# Patient Record
Sex: Male | Born: 2000 | Hispanic: Yes | Marital: Single | Smoking: Current every day smoker
Health system: Southern US, Community
[De-identification: ages and names within clinical notes are randomized; demographics above are authoritative.]

---

## 2020-02-07 ENCOUNTER — Emergency Department (HOSPITAL_COMMUNITY)
Admission: EM | Admit: 2020-02-07 | Discharge: 2020-02-08 | Disposition: A | Discharge status: Home / Self Care | Payer: Self-pay | Attending: Emergency Medicine | Admitting: Emergency Medicine

## 2020-02-07 ENCOUNTER — Other Ambulatory Visit: Payer: Self-pay

## 2020-02-07 ENCOUNTER — Encounter (HOSPITAL_COMMUNITY): Payer: Self-pay | Admitting: Emergency Medicine

## 2020-02-07 DIAGNOSIS — Z20822 Contact with and (suspected) exposure to covid-19: Secondary | ICD-10-CM | POA: Insufficient documentation

## 2020-02-07 DIAGNOSIS — G252 Other specified forms of tremor: Secondary | ICD-10-CM | POA: Insufficient documentation

## 2020-02-07 DIAGNOSIS — F17210 Nicotine dependence, cigarettes, uncomplicated: Secondary | ICD-10-CM | POA: Insufficient documentation

## 2020-02-07 LAB — CBC WITH DIFFERENTIAL/PLATELET
Abs Immature Granulocytes: 0.05 10*3/uL (ref 0.00–0.07)
Basophils Absolute: 0.1 10*3/uL (ref 0.0–0.1)
Basophils Relative: 1 %
Eosinophils Absolute: 0.4 10*3/uL (ref 0.0–0.5)
Eosinophils Relative: 5 %
HCT: 48.3 % (ref 39.0–52.0)
Hemoglobin: 15.9 g/dL (ref 13.0–17.0)
Immature Granulocytes: 1 %
Lymphocytes Relative: 30 %
Lymphs Abs: 2.6 10*3/uL (ref 0.7–4.0)
MCH: 30 pg (ref 26.0–34.0)
MCHC: 32.9 g/dL (ref 30.0–36.0)
MCV: 91.1 fL (ref 80.0–100.0)
Monocytes Absolute: 0.8 10*3/uL (ref 0.1–1.0)
Monocytes Relative: 9 %
Neutro Abs: 5 10*3/uL (ref 1.7–7.7)
Neutrophils Relative %: 54 %
Platelets: 346 10*3/uL (ref 150–400)
RBC: 5.3 MIL/uL (ref 4.22–5.81)
RDW: 13.2 % (ref 11.5–15.5)
WBC: 9 10*3/uL (ref 4.0–10.5)
nRBC: 0 % (ref 0.0–0.2)

## 2020-02-07 LAB — COMPREHENSIVE METABOLIC PANEL
ALT: 33 U/L (ref 0–44)
AST: 26 U/L (ref 15–41)
Albumin: 4.8 g/dL (ref 3.5–5.0)
Alkaline Phosphatase: 75 U/L (ref 38–126)
Anion gap: 8 (ref 5–15)
BUN: 17 mg/dL (ref 6–20)
CO2: 27 mmol/L (ref 22–32)
Calcium: 9.5 mg/dL (ref 8.9–10.3)
Chloride: 102 mmol/L (ref 98–111)
Creatinine, Ser: 0.75 mg/dL (ref 0.61–1.24)
GFR calc Af Amer: >60 mL/min (ref >60)
GFR calc non Af Amer: >60 mL/min (ref >60)
Glucose, Bld: 96 mg/dL (ref 70–99)
Potassium: 3.9 mmol/L (ref 3.5–5.1)
Sodium: 137 mmol/L (ref 135–145)
Total Bilirubin: 0.8 mg/dL (ref 0.3–1.2)
Total Protein: 8.3 g/dL — ABNORMAL HIGH (ref 6.5–8.1)

## 2020-02-07 LAB — ETHANOL: Alcohol, Ethyl (B): <10 mg/dL (ref <10)

## 2020-02-07 LAB — POC SARS CORONAVIRUS 2 AG -  ED: SARS Coronavirus 2 Ag: NEGATIVE

## 2020-02-07 LAB — CBG MONITORING, ED: Glucose-Capillary: 95 mg/dL (ref 70–99)

## 2020-02-07 MED ORDER — LORAZEPAM 1 MG PO TABS
1.0000 mg | ORAL_TABLET | Freq: Once | ORAL | Status: AC
  Administered 2020-02-07: 1 mg via ORAL
  Filled 2020-02-07: qty 1

## 2020-02-07 NOTE — ED Provider Notes (Signed)
[facility] EMERGENCY DEPARTMENT Provider Note   CSN: [id number]  Arrival date & time: 02/07/20  1906     History Chief Complaint  Patient presents with  . Tremors    Lee Dennis is a 19 y.o. male presenting to the ED with neck pain and tremors.  Patient reports he woke up this morning feeling painful sensation in the back of his neck.  He said his felt very tremulous in his arms and hands.  He said he went to work where he works at [employer], reports that he was increasingly tremulous and began to feel lightheaded around noon.  He was very emotional breakdown was crying.  He said this is never happened him before.  He continues to feel very shaky.  He denies any headache.  He denies any abdominal pain.  He denies nausea vomiting or diarrhea.  He denies any chest pain or shortness of breath.  Says he wears his Pap and protective care at work  HPI     History reviewed. No pertinent past medical history.  There are no problems to display for this patient.   History reviewed. No pertinent surgical history.     No family history on file.  Social History   Tobacco Use  . Smoking status: Current Every Day Smoker    Packs/day: 0.50    Types: Cigarettes  . Smokeless tobacco: Never Used  Substance Use Topics  . Alcohol use: Yes    Alcohol/week: 6.0 standard drinks    Types: 6 Cans of beer per week    Comment: occ  . Drug use: Not on file    Home Medications Prior to Admission medications   Not on File    Allergies    Patient has no allergy information on record.  Review of Systems   Review of Systems  Constitutional: Negative for chills and fever.  Eyes: Negative for pain and visual disturbance.  Respiratory: Negative for cough and shortness of breath.   Cardiovascular: Negative for chest pain and palpitations.  Gastrointestinal: Negative for abdominal pain and vomiting.  Skin: Negative for color change and rash.  Neurological: Positive for  tremors and light-headedness. Negative for syncope, facial asymmetry, speech difficulty and headaches.  All other systems reviewed and are negative.   Physical Exam Updated Vital Signs BP 135/82   Pulse 80   Temp 98.2 F (36.8 C) (Oral)   Resp 20   Ht 5\' 8"  (1.727 m)   Wt 111.6 kg   SpO2 100%   BMI 37.40 kg/m   Physical Exam Vitals and nursing note reviewed.  Constitutional:      Appearance: He is well-developed.  HENT:     Head: Normocephalic and atraumatic.     Comments: No discoloration of the gum lines Eyes:     Conjunctiva/sclera: Conjunctivae normal.  Cardiovascular:     Rate and Rhythm: Normal rate and regular rhythm.     Pulses: Normal pulses.     Heart sounds: No murmur.  Pulmonary:     Effort: Pulmonary effort is normal. No respiratory distress.     Breath sounds: Normal breath sounds.  Musculoskeletal:     Cervical back: Neck supple. No rigidity.  Skin:    General: Skin is warm and dry.  Neurological:     Mental Status: He is alert.     GCS: GCS eye subscore is 4. GCS verbal subscore is 5. GCS motor subscore is 6.     Cranial Nerves: Cranial nerves  are intact. No dysarthria or facial asymmetry.     Sensory: Sensation is intact.     Motor: Tremor present. No weakness.     Coordination: Coordination is intact.     Gait: Gait is intact.     Comments: Bilateral hand tremors at rest  Psychiatric:     Comments: Anxious     ED Results / Procedures / Treatments   Labs (all labs ordered are listed, but only abnormal results are displayed) Labs Reviewed  COMPREHENSIVE METABOLIC PANEL - Abnormal; Notable for the following components:      Result Value   Total Protein 8.3 (*)    All other components within normal limits  CBC WITH DIFFERENTIAL/PLATELET  ETHANOL  CBG MONITORING, ED  POC SARS CORONAVIRUS 2 AG -  ED    EKG EKG Interpretation  Date/Time:  Thursday February 07 2020 21:42:21 EST Ventricular Rate:  88 PR Interval:    QRS Duration: 84 QT  Interval:  343 QTC Calculation: 415 R Axis:   68 Text Interpretation: Sinus rhythm No STEMI Confirmed by Alvester Chou [phone]) on 02/07/2020 11:30:32 PM   Radiology No results found.  Procedures Procedures (including critical care time)  Medications Ordered in ED Medications  LORazepam (ATIVAN) tablet 1 mg (1 mg Oral Given 02/07/20 2142)    ED Course  I have reviewed the triage vital signs and the nursing notes.  Pertinent labs & imaging results that were available during my care of the patient were reviewed by me and considered in my medical decision making (see chart for details).  19 yo male presenting to ED with bilateral hands tremors, feeling of anxiety, and episode of near-syncope or lightheadedness while at work.    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  He is due for a test tomorrow.  He does not want one here.  His hgb does not show microcytic anemia consistent with lead exposure.  He also does not describe any other symptoms of chronic lead toxicity (eg. Sleep disturbance, GI upset, constipation, tremulousness). I think it's reasonable to let him have this testing done tomorrow.  His bloodwork is otherwise unremarkable.  Electrolytes are wnl.  ECG is benign, no evidence of arrythmia or heart block.  I have a low suspicion for PE or stroke based on this presentation.  He thinks about 1-2 beers per day, but has no hx of etoh withdrawal, and often goes many days without drinking.  This seems less likely a cause.  I gave a dose of ativan here, he feels it significantly helped his tremors.  There may be an anxiety component to this, particularly regarding his near-syncope.  However I stressed the importance of follow up as an outpatient, and reminded him that the ED cannot perform an exhaustive diagnostic workup.  There may still  be an underlying medical cause for his symptoms.  He verbalized understanding.   Final Clinical Impression(s) / ED Diagnoses Final diagnoses:  Coarse tremors    Rx / DC Orders ED Discharge Orders    None       Terald Sleeper, MD 02/08/20 1218

## 2020-02-07 NOTE — Discharge Instructions (Addendum)
Your work-up today in the emergency department did not show any emergent causes for your symptoms.  I did recommend that you follow-up for your lead blood level testing as scheduled with your workplace tomorrow.  You declined to have this test drawn in the ER.  Otherwise, your blood work did not show any evidence of dehydration, electrolyte abnormalities, or infection.  Your EKG did not show any issues with your heart, including arrhythmias or heart attack.  Your Covid test was negative.  Although your work-up here was negative, this does not mean that there is not a medical reason for your symptoms.  We therefore strongly recommend that you follow-up with your primary care doctor or in the wellness clinic if you continue having symptoms.  Keep yourself hydrated and take a day or 2 to rest at home.

## 2020-02-07 NOTE — ED Triage Notes (Signed)
Patient states he woke up with tremors that began in the back of his neck, hands and arms. Tremors are visible in triage. Patient states he works for a [employer] and he may have a lead exposure.

## 2020-02-21 ENCOUNTER — Other Ambulatory Visit: Payer: Self-pay

## 2020-02-21 ENCOUNTER — Emergency Department (HOSPITAL_COMMUNITY)
Admission: EM | Admit: 2020-02-21 | Discharge: 2020-02-22 | Disposition: A | Discharge status: Home / Self Care | Payer: Self-pay | Attending: Emergency Medicine | Admitting: Emergency Medicine

## 2020-02-21 ENCOUNTER — Emergency Department (HOSPITAL_COMMUNITY): Payer: Self-pay

## 2020-02-21 ENCOUNTER — Encounter (HOSPITAL_COMMUNITY): Payer: Self-pay

## 2020-02-21 DIAGNOSIS — Z20822 Contact with and (suspected) exposure to covid-19: Secondary | ICD-10-CM | POA: Insufficient documentation

## 2020-02-21 DIAGNOSIS — F17210 Nicotine dependence, cigarettes, uncomplicated: Secondary | ICD-10-CM | POA: Insufficient documentation

## 2020-02-21 DIAGNOSIS — Z79899 Other long term (current) drug therapy: Secondary | ICD-10-CM | POA: Insufficient documentation

## 2020-02-21 DIAGNOSIS — F419 Anxiety disorder, unspecified: Secondary | ICD-10-CM | POA: Insufficient documentation

## 2020-02-21 DIAGNOSIS — R1013 Epigastric pain: Secondary | ICD-10-CM | POA: Insufficient documentation

## 2020-02-21 LAB — COMPREHENSIVE METABOLIC PANEL
ALT: 37 U/L (ref 0–44)
AST: 24 U/L (ref 15–41)
Albumin: 4.3 g/dL (ref 3.5–5.0)
Alkaline Phosphatase: 70 U/L (ref 38–126)
Anion gap: 9 (ref 5–15)
BUN: 18 mg/dL (ref 6–20)
CO2: 26 mmol/L (ref 22–32)
Calcium: 9.3 mg/dL (ref 8.9–10.3)
Chloride: 104 mmol/L (ref 98–111)
Creatinine, Ser: 0.85 mg/dL (ref 0.61–1.24)
GFR calc Af Amer: >60 mL/min (ref >60)
GFR calc non Af Amer: >60 mL/min (ref >60)
Glucose, Bld: 104 mg/dL — ABNORMAL HIGH (ref 70–99)
Potassium: 3.8 mmol/L (ref 3.5–5.1)
Sodium: 139 mmol/L (ref 135–145)
Total Bilirubin: 0.3 mg/dL (ref 0.3–1.2)
Total Protein: 7.3 g/dL (ref 6.5–8.1)

## 2020-02-21 LAB — CBC WITH DIFFERENTIAL/PLATELET
Abs Immature Granulocytes: 0.05 10*3/uL (ref 0.00–0.07)
Basophils Absolute: 0.1 10*3/uL (ref 0.0–0.1)
Basophils Relative: 1 %
Eosinophils Absolute: 0.7 10*3/uL — ABNORMAL HIGH (ref 0.0–0.5)
Eosinophils Relative: 8 %
HCT: 45 % (ref 39.0–52.0)
Hemoglobin: 14.4 g/dL (ref 13.0–17.0)
Immature Granulocytes: 1 %
Lymphocytes Relative: 26 %
Lymphs Abs: 2.2 10*3/uL (ref 0.7–4.0)
MCH: 29.6 pg (ref 26.0–34.0)
MCHC: 32 g/dL (ref 30.0–36.0)
MCV: 92.6 fL (ref 80.0–100.0)
Monocytes Absolute: 0.8 10*3/uL (ref 0.1–1.0)
Monocytes Relative: 9 %
Neutro Abs: 4.8 10*3/uL (ref 1.7–7.7)
Neutrophils Relative %: 55 %
Platelets: 312 10*3/uL (ref 150–400)
RBC: 4.86 MIL/uL (ref 4.22–5.81)
RDW: 13.2 % (ref 11.5–15.5)
WBC: 8.6 10*3/uL (ref 4.0–10.5)
nRBC: 0 % (ref 0.0–0.2)

## 2020-02-21 LAB — LIPASE, BLOOD: Lipase: 20 U/L (ref 11–51)

## 2020-02-21 MED ORDER — SERTRALINE HCL 50 MG PO TABS: 50.0000 mg | ORAL_TABLET | Freq: Every day | ORAL | 0 refills | Status: AC

## 2020-02-21 NOTE — ED Notes (Signed)
Pt in bed, cardiac monitor in place, pt appears to be in sinus rhythm, pt states that he pain started yesterday when he woke, states that he is better today, states that his pain is worse when he exhales.  resps even and unlabored.  Pt talking in full complete sentences.  Call bell within reach .

## 2020-02-21 NOTE — Discharge Instructions (Addendum)
Your testing was unremarkable - the cause of your pain is not clear but does not appear to be your heart or your lungs or your abdominal organs - start taking pepcid daily for 30 days You may have some anxiety - with the tremor in your arms - please start taking zoloft - I have precribed this medicine for your for one month, you will need to keep taking this (don't stop abruptly), please see the list below for local family doctor follow ups.  ER for worsening symptoms.  [facility] Primary Care Doctor List    Kari Baars MD. Specialty: Pulmonary Disease Contact information: [address]  [address]  [place] [address]  [phone]   Syliva Overman, MD. Specialty: [facility] Medicine Contact information: [address]  [place] [address]  [phone]   Lilyan Punt, MD. Specialty: [facility] Medicine Contact information: [address]  [place] [address]  [phone]   Avon Gully, MD Specialty: Internal Medicine Contact information: [address] [place] [address]  [phone]   Catalina Pizza, MD. Specialty: Internal Medicine Contact information: [address]  [place] [address]  [phone]    [facility] (Dr. Selena Batten) Specialty: Family Medicine Contact information: [address]  [place] [address]  [phone]   John Giovanni, MD. Specialty: [facility] Medicine Contact information: [address]  [address]  [place] [address]  [phone]   Carylon Perches, MD. Specialty: Internal Medicine Contact information: [address]  [address]  [place] [address]  [phone]    [facility]  [address] [place], [place] [address] [phone]  Services [facility] offers a variety of basic health services.  Services include but are not limited to: Blood pressure checks  Heart rate checks  Blood sugar checks  Urine  analysis  Rapid strep tests  Pregnancy tests.  Health education and referrals  People needing more complex services will be directed to a physician online. Using these virtual visits, doctors can evaluate and prescribe medicine and treatments. There will be no medication on-site, though [facility] will help patients fill their prescriptions at little to no cost.   For More information please go to: [email or web address]

## 2020-02-21 NOTE — ED Triage Notes (Signed)
Pt reports pain with deep breathing that started yesterday, pain to left and right side of chest and into his back on both sides.  Pt also reports "shaking" and tremors regularly.  Pt seen a week ago for same.

## 2020-02-21 NOTE — ED Provider Notes (Signed)
[facility] EMERGENCY DEPARTMENT Provider Note   CSN: [id number] Arrival date & time: 02/21/20  2130     History Chief Complaint  Patient presents with  . Chest Pain    Lee Dennis is a 19 y.o. male.  HPI   This patient is a 19 year old male, he has moved to this area recently approximately 3 months ago after losing his job at [employer].  The patient has no known history of abdominal surgery, no history of cardiac disease or lung disease.  He reports that he has recently as of yesterday started to develop some pain in his upper abdomen and lower chest bilaterally which occurs when he breathes out.  He does not have pain with breathing in, he is not coughing or feeling particularly short of breath.  He has no swelling of his legs, he does not smoke cigarettes anymore, he is currently using a vape which he is not very is very much of the last week.  He reports multiple people in the house have had a recent respiratory illness including his wife who has been coughing and a 50-year-old child who is also had a recent cough and a runny nose.  He denies any diarrhea, bleeding, rashes, though he does state that he has been nauseated and when he eats it makes the pain worse.  He does drink alcohol, states it takes him about 1 month ago through a case of beer.  The patient is actively tremulous and states that he does not have a history of mental health disorders anxiety or hallucinations.  Review of the medical record shows that the patient has similar presentation on March 4 where he had bilateral tremor of the upper extremities.  There was no complaint of abdominal discomfort at that time.  He was concerned about lead exposure at that time secondary to working in a [employer].  History reviewed. No pertinent past medical history.  There are no problems to display for this patient.   History reviewed. No pertinent surgical history.     No family history on file.  Social History    Tobacco Use  . Smoking status: Current Every Day Smoker    Packs/day: 0.50    Types: Cigarettes  . Smokeless tobacco: Never Used  Substance Use Topics  . Alcohol use: Yes    Alcohol/week: 6.0 standard drinks    Types: 6 Cans of beer per week    Comment: occ  . Drug use: Yes    Types: Marijuana    Comment: CBD    Home Medications Prior to Admission medications   Medication Sig Start Date End Date Taking? Authorizing Provider  sertraline (ZOLOFT) 50 MG tablet Take 1 tablet (50 mg total) by mouth daily. 02/21/20 03/22/20  Eber Hong, MD    Allergies    Patient has no known allergies.  Review of Systems   Review of Systems  All other systems reviewed and are negative.   Physical Exam Updated Vital Signs BP (!) 150/99 (BP Location: Right Arm)   Pulse 92   Temp 98.8 F (37.1 C) (Oral)   Resp 20   Ht 1.727 m (5\' 8" )   Wt 103 kg   SpO2 100%   BMI 34.52 kg/m   Physical Exam Vitals and nursing note reviewed.  Constitutional:      General: He is not in acute distress.    Appearance: He is well-developed.  HENT:     Head: Normocephalic and atraumatic.  Mouth/Throat:     Pharynx: No oropharyngeal exudate.  Eyes:     General: No scleral icterus.       Right eye: No discharge.        Left eye: No discharge.     Conjunctiva/sclera: Conjunctivae normal.     Pupils: Pupils are equal, round, and reactive to light.  Neck:     Thyroid: No thyromegaly.     Vascular: No JVD.  Cardiovascular:     Rate and Rhythm: Normal rate and regular rhythm.     Heart sounds: Normal heart sounds. No murmur. No friction rub. No gallop.   Pulmonary:     Effort: Pulmonary effort is normal. No respiratory distress.     Breath sounds: Normal breath sounds. No wheezing or rales.  Abdominal:     General: Bowel sounds are normal. There is no distension.     Palpations: Abdomen is soft. There is no mass.     Tenderness: There is abdominal tenderness.     Comments: Abdominal tenderness  to palpation across the right upper left upper and mid epigastrium.  No tenderness over the chest  Musculoskeletal:        General: No tenderness. Normal range of motion.     Cervical back: Normal range of motion and neck supple.  Lymphadenopathy:     Cervical: No cervical adenopathy.  Skin:    General: Skin is warm and dry.     Findings: No erythema or rash.  Neurological:     Mental Status: He is alert.     Coordination: Coordination normal.     Comments: Tremor of the bilateral hands right more than left, otherwise neurologic exam is unremarkable with clear speech, normal strength in bilateral arms and legs, normal gait  Psychiatric:        Behavior: Behavior normal.     ED Results / Procedures / Treatments   Labs (all labs ordered are listed, but only abnormal results are displayed) Labs Reviewed  CBC WITH DIFFERENTIAL/PLATELET - Abnormal; Notable for the following components:      Result Value   Eosinophils Absolute 0.7 (*)    All other components within normal limits  SARS CORONAVIRUS 2 (TAT 6-24 HRS)  COMPREHENSIVE METABOLIC PANEL  LIPASE, BLOOD  URINALYSIS, ROUTINE W REFLEX MICROSCOPIC  RAPID URINE DRUG SCREEN, HOSP PERFORMED    EKG EKG Interpretation  Date/Time:  Thursday February 21 2020 21:52:37 EDT Ventricular Rate:  95 PR Interval:    QRS Duration: 87 QT Interval:  314 QTC Calculation: 395 R Axis:   64 Text Interpretation: Sinus rhythm Baseline wander in lead(s) II III aVF since last tracing no significant change Confirmed by Eber Hong ([id number]) on 02/21/2020 10:04:40 PM   Radiology DG Chest Port 1 View  Result Date: 02/21/2020 CLINICAL DATA:  Cough EXAM: PORTABLE CHEST 1 VIEW COMPARISON:  None. FINDINGS: The heart size and mediastinal contours are within normal limits. Both lungs are clear. The visualized skeletal structures are unremarkable. IMPRESSION: No active disease. Electronically Signed   By: Katherine Mantle M.D.   On: 02/21/2020 22:34     Procedures Procedures (including critical care time)  Medications Ordered in ED Medications - No data to display  ED Course  I have reviewed the triage vital signs and the nursing notes.  Pertinent labs & imaging results that were available during my care of the patient were reviewed by me and considered in my medical decision making (see chart for details).    MDM Rules/Calculators/A&P  The patient still appears anxious with the tremor, he sometimes speaks rapidly, he has focal abdominal tenderness across the upper abdomen and is otherwise not seeming to be decompensated with regards to mental status.  Rule out pancreatitis, gallbladder disease, check a urinalysis and a urine drug screen, he likely has some component of anxiety related to this as well.  He is driving himself here tonight and cannot have Ativan which did seem to improve his symptoms significantly last time  Discussed case with Dr. Tomi Bamberger who has assumed care of the patient to follow up results and disposition accordingly - will plan on starting sertraline for anxiety - pepcid for possible PUD.  CBC normal  Final Clinical Impression(s) / ED Diagnoses Final diagnoses:  Epigastric pain  Anxiety    Rx / DC Orders ED Discharge Orders         Ordered    sertraline (ZOLOFT) 50 MG tablet  Daily     02/21/20 2316           Noemi Chapel, MD 02/21/20 2317

## 2020-02-22 LAB — SARS CORONAVIRUS 2 (TAT 6-24 HRS): SARS Coronavirus 2: NEGATIVE

## 2020-02-22 NOTE — ED Provider Notes (Signed)
Patient left at change of shift to get results of his laboratory tests.  We discussed his test results.  Patient is comfortable being discharged home.  He has noted to have a mild tremor at times.  He is requesting a work note for the 19th.    Results for orders placed or performed during the hospital encounter of 02/21/20  CBC with Differential/Platelet  Result Value Ref Range   WBC 8.6 4.0 - 10.5 K/uL   RBC 4.86 4.22 - 5.81 MIL/uL   Hemoglobin 14.4 13.0 - 17.0 g/dL   HCT 60.7 37.1 - 06.2 %   MCV 92.6 80.0 - 100.0 fL   MCH 29.6 26.0 - 34.0 pg   MCHC 32.0 30.0 - 36.0 g/dL   RDW 69.4 85.4 - 62.7 %   Platelets 312 150 - 400 K/uL   nRBC 0.0 0.0 - 0.2 %   Neutrophils Relative % 55 %   Neutro Abs 4.8 1.7 - 7.7 K/uL   Lymphocytes Relative 26 %   Lymphs Abs 2.2 0.7 - 4.0 K/uL   Monocytes Relative 9 %   Monocytes Absolute 0.8 0.1 - 1.0 K/uL   Eosinophils Relative 8 %   Eosinophils Absolute 0.7 (H) 0.0 - 0.5 K/uL   Basophils Relative 1 %   Basophils Absolute 0.1 0.0 - 0.1 K/uL   Immature Granulocytes 1 %   Abs Immature Granulocytes 0.05 0.00 - 0.07 K/uL  Comprehensive metabolic panel  Result Value Ref Range   Sodium 139 135 - 145 mmol/L   Potassium 3.8 3.5 - 5.1 mmol/L   Chloride 104 98 - 111 mmol/L   CO2 26 22 - 32 mmol/L   Glucose, Bld 104 (H) 70 - 99 mg/dL   BUN 18 6 - 20 mg/dL   Creatinine, Ser 0.35 0.61 - 1.24 mg/dL   Calcium 9.3 8.9 - 00.9 mg/dL   Total Protein 7.3 6.5 - 8.1 g/dL   Albumin 4.3 3.5 - 5.0 g/dL   AST 24 15 - 41 U/L   ALT 37 0 - 44 U/L   Alkaline Phosphatase 70 38 - 126 U/L   Total Bilirubin 0.3 0.3 - 1.2 mg/dL   GFR calc non Af Amer >60 >60 mL/min   GFR calc Af Amer >60 >60 mL/min   Anion gap 9 5 - 15  Lipase, blood  Result Value Ref Range   Lipase 20 11 - 51 U/L   Laboratory interpretation all normal    DG Chest Port 1 View  Result Date: 02/21/2020 CLINICAL DATA:  Cough EXAM: PORTABLE CHEST 1 VIEW COMPARISON:  None. FINDINGS: The heart size and  mediastinal contours are within normal limits. Both lungs are clear. The visualized skeletal structures are unremarkable. IMPRESSION: No active disease. Electronically Signed   By: Katherine Mantle M.D.   On: 02/21/2020 22:34      Devoria Albe, MD 02/22/20 [phone]

## 2020-02-22 NOTE — ED Notes (Signed)
Pt ambulatory to waiting room. Pt verbalized understanding of discharge instructions.   

## 2020-02-28 ENCOUNTER — Telehealth: Payer: Self-pay | Admitting: *Deleted

## 2020-02-28 NOTE — Telephone Encounter (Signed)
Pt called for COVID test results which were negative, Pt needing results faxed, number to [facility] given, pt verbalized understanding.

## 2020-03-06 ENCOUNTER — Other Ambulatory Visit: Payer: Self-pay

## 2020-03-06 ENCOUNTER — Ambulatory Visit: Payer: HRSA Program | Attending: Internal Medicine

## 2020-03-06 DIAGNOSIS — Z20822 Contact with and (suspected) exposure to covid-19: Secondary | ICD-10-CM | POA: Insufficient documentation

## 2020-03-07 LAB — NOVEL CORONAVIRUS, NAA: SARS-CoV-2, NAA: NOT DETECTED

## 2020-03-07 LAB — SARS-COV-2, NAA 2 DAY TAT

## 2020-03-10 ENCOUNTER — Telehealth: Payer: Self-pay | Admitting: *Deleted

## 2020-03-10 NOTE — Telephone Encounter (Signed)
Reviewed negative Covid 19 results with the patient. Provided MyChart instructions to obtain a copy or screenshot of the results.

## 2021-06-15 ENCOUNTER — Other Ambulatory Visit: Payer: Self-pay

## 2021-06-15 ENCOUNTER — Encounter (HOSPITAL_COMMUNITY): Payer: Self-pay

## 2021-06-15 ENCOUNTER — Emergency Department (HOSPITAL_COMMUNITY)
Admission: EM | Admit: 2021-06-15 | Discharge: 2021-06-15 | Disposition: A | Discharge status: Home / Self Care | Payer: Medicaid Other | Attending: Emergency Medicine | Admitting: Emergency Medicine

## 2021-06-15 DIAGNOSIS — G252 Other specified forms of tremor: Secondary | ICD-10-CM

## 2021-06-15 DIAGNOSIS — R42 Dizziness and giddiness: Secondary | ICD-10-CM | POA: Diagnosis not present

## 2021-06-15 DIAGNOSIS — R519 Headache, unspecified: Secondary | ICD-10-CM | POA: Insufficient documentation

## 2021-06-15 DIAGNOSIS — R112 Nausea with vomiting, unspecified: Secondary | ICD-10-CM | POA: Diagnosis not present

## 2021-06-15 DIAGNOSIS — R1013 Epigastric pain: Secondary | ICD-10-CM | POA: Insufficient documentation

## 2021-06-15 DIAGNOSIS — F17210 Nicotine dependence, cigarettes, uncomplicated: Secondary | ICD-10-CM | POA: Insufficient documentation

## 2021-06-15 DIAGNOSIS — R251 Tremor, unspecified: Secondary | ICD-10-CM | POA: Insufficient documentation

## 2021-06-15 LAB — COMPREHENSIVE METABOLIC PANEL
ALT: 37 U/L (ref 0–44)
AST: 26 U/L (ref 15–41)
Albumin: 4.6 g/dL (ref 3.5–5.0)
Alkaline Phosphatase: 79 U/L (ref 38–126)
Anion gap: 5 (ref 5–15)
BUN: 12 mg/dL (ref 6–20)
CO2: 32 mmol/L (ref 22–32)
Calcium: 9.3 mg/dL (ref 8.9–10.3)
Chloride: 102 mmol/L (ref 98–111)
Creatinine, Ser: 0.8 mg/dL (ref 0.61–1.24)
GFR, Estimated: >60 mL/min (ref >60)
Glucose, Bld: 107 mg/dL — ABNORMAL HIGH (ref 70–99)
Potassium: 3.9 mmol/L (ref 3.5–5.1)
Sodium: 139 mmol/L (ref 135–145)
Total Bilirubin: 0.3 mg/dL (ref 0.3–1.2)
Total Protein: 8.1 g/dL (ref 6.5–8.1)

## 2021-06-15 LAB — CBC WITH DIFFERENTIAL/PLATELET
Abs Immature Granulocytes: 0.17 10*3/uL — ABNORMAL HIGH (ref 0.00–0.07)
Basophils Absolute: 0.1 10*3/uL (ref 0.0–0.1)
Basophils Relative: 1 %
Eosinophils Absolute: 0.4 10*3/uL (ref 0.0–0.5)
Eosinophils Relative: 4 %
HCT: 48 % (ref 39.0–52.0)
Hemoglobin: 16.3 g/dL (ref 13.0–17.0)
Immature Granulocytes: 2 %
Lymphocytes Relative: 25 %
Lymphs Abs: 2.7 10*3/uL (ref 0.7–4.0)
MCH: 31 pg (ref 26.0–34.0)
MCHC: 34 g/dL (ref 30.0–36.0)
MCV: 91.3 fL (ref 80.0–100.0)
Monocytes Absolute: 0.8 10*3/uL (ref 0.1–1.0)
Monocytes Relative: 8 %
Neutro Abs: 6.5 10*3/uL (ref 1.7–7.7)
Neutrophils Relative %: 60 %
Platelets: 356 10*3/uL (ref 150–400)
RBC: 5.26 MIL/uL (ref 4.22–5.81)
RDW: 13 % (ref 11.5–15.5)
WBC: 10.8 10*3/uL — ABNORMAL HIGH (ref 4.0–10.5)
nRBC: 0 % (ref 0.0–0.2)

## 2021-06-15 LAB — LIPASE, BLOOD: Lipase: 26 U/L (ref 11–51)

## 2021-06-15 MED ORDER — MECLIZINE HCL 12.5 MG PO TABS
25.0000 mg | ORAL_TABLET | Freq: Once | ORAL | Status: AC
  Administered 2021-06-15: 25 mg via ORAL
  Filled 2021-06-15: qty 2

## 2021-06-15 MED ORDER — SODIUM CHLORIDE 0.9 % IV BOLUS
500.0000 mL | Freq: Once | INTRAVENOUS | Status: AC
  Administered 2021-06-15: 500 mL via INTRAVENOUS

## 2021-06-15 MED ORDER — MECLIZINE HCL 25 MG PO TABS: 25.0000 mg | ORAL_TABLET | Freq: Three times a day (TID) | ORAL | 0 refills | Status: AC | PRN

## 2021-06-15 MED ORDER — PANTOPRAZOLE SODIUM 40 MG IV SOLR
40.0000 mg | Freq: Once | INTRAVENOUS | Status: AC
  Administered 2021-06-15: 40 mg via INTRAVENOUS
  Filled 2021-06-15: qty 40

## 2021-06-15 NOTE — ED Provider Notes (Signed)
Emergency Department Provider Note   I have reviewed the triage vital signs and the nursing notes.   HISTORY  Chief Complaint Emesis   HPI Lee Dennis is a 20 y.o. male with no significant past medical history presents to the emergency department with acute onset vomiting with burning epigastric pain and vertigo type symptoms.  He states that symptoms began abruptly at 5 AM this morning.  He drinks only occasionally and denies daily drinking or alcohol withdrawal history.  He notes a vertigo type feeling with some mild headache.  He denies any fevers.  No sore throat or respiratory symptoms.  The pain he is experiencing is in the epigastric region and he describes it as burning.  Some radiation into the lower chest also a burning feeling.  Denies associated diarrhea.  No lower abdominal pain.  No blood in the emesis or stool. Denies drug use. No tinnitus.   History reviewed. No pertinent past medical history.  There are no problems to display for this patient.   History reviewed. No pertinent surgical history.  Allergies Patient has no known allergies.  No family history on file.  Social History Social History   Tobacco Use   Smoking status: Every Day    Packs/day: 0.50    Pack years: 0.00    Types: Cigarettes   Smokeless tobacco: Never  Vaping Use   Vaping Use: Every day   Substances: CBD  Substance Use Topics   Alcohol use: Yes    Alcohol/week: 6.0 standard drinks    Types: 6 Cans of beer per week    Comment: occ   Drug use: Yes    Types: Marijuana    Comment: CBD    Review of Systems  Constitutional: No fever/chills Eyes: No visual changes. ENT: No sore throat. Positive vertigo symptoms.  Cardiovascular: Denies chest pain. Respiratory: Denies shortness of breath. Gastrointestinal: Positive epigastric abdominal pain. Positive nausea and vomiting.  No diarrhea.  No constipation. Genitourinary: Negative for dysuria. Musculoskeletal: Negative for back  pain. Skin: Negative for rash. Neurological: Negative for focal weakness or numbness. Positive mild HA.   10-point ROS otherwise negative.  ____________________________________________   PHYSICAL EXAM:  VITAL SIGNS: Vitals:   06/15/21 0835 06/15/21 0930  BP: (!) 126/113 102/89  Pulse: 88 86  Resp: 20 18  Temp: 98.2 F (36.8 C)   SpO2: 96% 100%    Constitutional: Alert and oriented. Well appearing and in no acute distress. Eyes: Conjunctivae are normal. EOMI. Head: Atraumatic. Nose: No congestion/rhinnorhea. Mouth/Throat: Mucous membranes are slightly dry.  Oropharynx non-erythematous. Neck: No stridor.  No meningeal signs. Cardiovascular: Normal rate, regular rhythm. Good peripheral circulation. Grossly normal heart sounds.   Respiratory: Normal respiratory effort.  No retractions. Lungs CTAB. Gastrointestinal: Soft and nontender. No distention.  Musculoskeletal: No lower extremity tenderness nor edema. No gross deformities of extremities. Neurologic:  Normal speech and language. No gross focal neurologic deficits are appreciated. 5/5 strength in the bilateral upper extremities. Normal finger to nose testing. Slight tremor noted bilaterally.  Skin:  Skin is warm, dry and intact. No rash noted.   ____________________________________________   LABS (all labs ordered are listed, but only abnormal results are displayed)  Labs Reviewed  COMPREHENSIVE METABOLIC PANEL - Abnormal; Notable for the following components:      Result Value   Glucose, Bld 107 (*)    All other components within normal limits  CBC WITH DIFFERENTIAL/PLATELET - Abnormal; Notable for the following components:   WBC 10.8 (*)  Abs Immature Granulocytes 0.17 (*)    All other components within normal limits  LIPASE, BLOOD    ____________________________________________   PROCEDURES  Procedure(s) performed:   Procedures  None  ____________________________________________   INITIAL  IMPRESSION / ASSESSMENT AND PLAN / ED COURSE  Pertinent labs & imaging results that were available during my care of the patient were reviewed by me and considered in my medical decision making (see chart for details).   Patient presents to the emergency department with nausea and vomiting along with some vertigo symptoms.  Patient's presentation seems most consistent with acute gastritis.  He has a benign abdominal exam with no significant tenderness in the abdominal quadrants.  He does have some mild epigastric tenderness but no peritoneal findings.  He has hypertension but is not tachycardic.  He does seem clinically dehydrated which may be causing some of his vertigo symptoms.  I do not see signs or symptoms to suspect hypertensive emergency, stroke, other central process.  He does not appear septic.  His pain is mainly epigastric and burning in quality.  This is not consistent with PE or ACS although these were considered.  Plan for screening blood work, IV fluids, meclizine for vertigo as well as nausea/vomiting symptoms along with Protonix.   10:40 AM  Patient's lab work here is reassuring.  He is eating and drinking after meclizine and feeling much better.  Continues to have a resting tremor but states this has been present for over 1 year.  Advised that he follow close with the primary care doctor as this may require further follow-up.  Doubt central vertigo cause.  Patient's neuro exam remains within normal limits.  Discussed ED return precautions.  ____________________________________________  FINAL CLINICAL IMPRESSION(S) / ED DIAGNOSES  Final diagnoses:  Vertigo  Non-intractable vomiting with nausea, unspecified vomiting type  Resting tremor     MEDICATIONS GIVEN DURING THIS VISIT:  Medications  sodium chloride 0.9 % bolus 500 mL (0 mLs Intravenous Stopped 06/15/21 1031)  meclizine (ANTIVERT) tablet 25 mg (25 mg Oral Given 06/15/21 0943)  pantoprazole (PROTONIX) injection 40 mg (40  mg Intravenous Given 06/15/21 0943)     NEW OUTPATIENT MEDICATIONS STARTED DURING THIS VISIT:  New Prescriptions   MECLIZINE (ANTIVERT) 25 MG TABLET    Take 1 tablet (25 mg total) by mouth 3 (three) times daily as needed for dizziness.    Note:  This document was prepared using Dragon voice recognition software and may include unintentional dictation errors.  Alona Bene, MD, [facility]    Melvin Whiteford, Arlyss Repress, MD 06/15/21 1045

## 2021-06-15 NOTE — ED Triage Notes (Signed)
Pt presents to ED with complaints of emesis x 4 this morning.

## 2021-06-15 NOTE — Discharge Instructions (Addendum)
You were seen in the emergency department today with vertigo, vomiting.  I am treating your symptoms with meclizine.  You take these if you get vertigo/spinning symptoms.  This will also help with your nausea.  Please establish care with a primary care doctor.  I have listed on the paperwork for you but you can establish care with any provider that you wish.  They may recommend additional follow-up for your tremor.  Please return to the emergency department any new or suddenly worsening symptoms.

## 2021-06-23 ENCOUNTER — Emergency Department (HOSPITAL_COMMUNITY): Payer: Medicaid Other

## 2021-06-23 ENCOUNTER — Other Ambulatory Visit: Payer: Self-pay

## 2021-06-23 ENCOUNTER — Emergency Department (HOSPITAL_COMMUNITY)
Admission: EM | Admit: 2021-06-23 | Discharge: 2021-06-23 | Disposition: A | Discharge status: Home / Self Care | Payer: Medicaid Other | Attending: Emergency Medicine | Admitting: Emergency Medicine

## 2021-06-23 DIAGNOSIS — R42 Dizziness and giddiness: Secondary | ICD-10-CM | POA: Diagnosis not present

## 2021-06-23 DIAGNOSIS — I10 Essential (primary) hypertension: Secondary | ICD-10-CM | POA: Diagnosis not present

## 2021-06-23 DIAGNOSIS — Z79899 Other long term (current) drug therapy: Secondary | ICD-10-CM | POA: Diagnosis not present

## 2021-06-23 DIAGNOSIS — F17210 Nicotine dependence, cigarettes, uncomplicated: Secondary | ICD-10-CM | POA: Diagnosis not present

## 2021-06-23 LAB — BASIC METABOLIC PANEL
Anion gap: 12 (ref 5–15)
BUN: 13 mg/dL (ref 6–20)
CO2: 23 mmol/L (ref 22–32)
Calcium: 9.6 mg/dL (ref 8.9–10.3)
Chloride: 102 mmol/L (ref 98–111)
Creatinine, Ser: 0.78 mg/dL (ref 0.61–1.24)
GFR, Estimated: >60 mL/min (ref >60)
Glucose, Bld: 102 mg/dL — ABNORMAL HIGH (ref 70–99)
Potassium: 4.2 mmol/L (ref 3.5–5.1)
Sodium: 137 mmol/L (ref 135–145)

## 2021-06-23 LAB — CBC
HCT: 48 % (ref 39.0–52.0)
Hemoglobin: 16.1 g/dL (ref 13.0–17.0)
MCH: 30.8 pg (ref 26.0–34.0)
MCHC: 33.5 g/dL (ref 30.0–36.0)
MCV: 91.8 fL (ref 80.0–100.0)
Platelets: 362 10*3/uL (ref 150–400)
RBC: 5.23 MIL/uL (ref 4.22–5.81)
RDW: 13.2 % (ref 11.5–15.5)
WBC: 10.7 10*3/uL — ABNORMAL HIGH (ref 4.0–10.5)
nRBC: 0 % (ref 0.0–0.2)

## 2021-06-23 LAB — TROPONIN I (HIGH SENSITIVITY)
Troponin I (High Sensitivity): 3 ng/L (ref <18)
Troponin I (High Sensitivity): 3 ng/L (ref <18)

## 2021-06-23 MED ORDER — AMLODIPINE BESYLATE 2.5 MG PO TABS: 2.5000 mg | ORAL_TABLET | Freq: Every day | ORAL | 0 refills | Status: AC

## 2021-06-23 NOTE — ED Provider Notes (Signed)
[facility] EMERGENCY DEPARTMENT Provider Note   CSN: [id number] Arrival date & time: 06/23/21  0934     History Chief Complaint  Patient presents with   Hypertension    Lee Dennis is a 20 y.o. male.  Patient with no significant past medical history presents to the emergency department today for evaluation of elevated blood pressure.  Patient states that over the past couple of weeks he has had a couple of episodes of lightheadedness.  He told a family member who recommended he check his blood pressure.  Patient has checked his blood pressure with a family member's cuff as well as one which he recently purchased.  He has had blood pressures ranging from the 150s systolic range to the 200s at times.  He states that he has a history of migraines and has been having more frequent migraines.  He denies vision loss.  No significant chest pain or shortness of breath.  Normal urination.  Patient denies signs of stroke including: facial droop, slurred speech, aphasia, weakness/numbness in extremities, imbalance/trouble walking.  No PCP.  Family history of high blood pressure.  He states that his father had a stroke a few weeks ago.  Patient is a smoker but states that he has not had a cigarette in 3 days.       No past medical history on file.  There are no problems to display for this patient.   No past surgical history on file.     No family history on file.  Social History   Tobacco Use   Smoking status: Every Day    Packs/day: 0.50    Types: Cigarettes   Smokeless tobacco: Never  Vaping Use   Vaping Use: Every day   Substances: CBD  Substance Use Topics   Alcohol use: Yes    Alcohol/week: 6.0 standard drinks    Types: 6 Cans of beer per week    Comment: occ   Drug use: Yes    Types: Marijuana    Comment: CBD    Home Medications Prior to Admission medications   Medication Sig Start Date End Date Taking? Authorizing Provider  amLODipine (NORVASC)  2.5 MG tablet Take 1 tablet (2.5 mg total) by mouth daily. 06/23/21  Yes Renne Crigler, PA-C  meclizine (ANTIVERT) 25 MG tablet Take 1 tablet (25 mg total) by mouth 3 (three) times daily as needed for dizziness. 06/15/21  Yes Long, Arlyss Repress, MD  polyvinyl alcohol (LIQUIFILM TEARS) 1.4 % ophthalmic solution Place 1 drop into both eyes as needed for dry eyes.   Yes [provider]  sertraline (ZOLOFT) 50 MG tablet Take 1 tablet (50 mg total) by mouth daily. Patient not taking: Reported on 06/23/2021 02/21/20 03/22/20  Eber Hong, MD    Allergies    Patient has no known allergies.  Review of Systems   Review of Systems  Constitutional:  Negative for fever.  HENT:  Negative for rhinorrhea and sore throat.   Eyes:  Negative for redness.  Respiratory:  Negative for cough and shortness of breath.   Cardiovascular:  Negative for chest pain.  Gastrointestinal:  Negative for abdominal pain, diarrhea, nausea and vomiting.  Genitourinary:  Negative for dysuria and hematuria.  Musculoskeletal:  Negative for myalgias.  Skin:  Negative for rash.  Neurological:  Positive for dizziness and light-headedness. Negative for syncope and headaches.   Physical Exam Updated Vital Signs BP (!) 143/98   Pulse 78   Temp 98.5 F (36.9 C)  Resp 17   Ht 5\' 8"  (1.727 m)   Wt 103 kg   SpO2 98%   BMI 34.53 kg/m   Physical Exam Vitals and nursing note reviewed.  Constitutional:      Appearance: He is well-developed. He is not diaphoretic.  HENT:     Head: Normocephalic and atraumatic.     Mouth/Throat:     Mouth: Mucous membranes are not dry.  Eyes:     Conjunctiva/sclera: Conjunctivae normal.  Neck:     Vascular: Normal carotid pulses. No carotid bruit or JVD.     Trachea: Trachea normal. No tracheal deviation.  Cardiovascular:     Rate and Rhythm: Normal rate and regular rhythm.     Pulses: No decreased pulses.          Radial pulses are 2+ on the right side and 2+ on the left side.      Heart sounds: Normal heart sounds, S1 normal and S2 normal. Heart sounds not distant. No murmur heard. Pulmonary:     Effort: Pulmonary effort is normal. No respiratory distress.     Breath sounds: Normal breath sounds. No wheezing.  Chest:     Chest wall: No tenderness.  Abdominal:     General: Bowel sounds are normal.     Palpations: Abdomen is soft.     Tenderness: There is no abdominal tenderness. There is no guarding or rebound.  Musculoskeletal:     Cervical back: Normal range of motion and neck supple. No muscular tenderness.     Right lower leg: No edema.     Left lower leg: No edema.  Skin:    General: Skin is warm and dry.     Coloration: Skin is not pale.  Neurological:     Mental Status: He is alert. Mental status is at baseline.  Psychiatric:        Mood and Affect: Mood normal.    ED Results / Procedures / Treatments   Labs (all labs ordered are listed, but only abnormal results are displayed) Labs Reviewed  BASIC METABOLIC PANEL - Abnormal; Notable for the following components:      Result Value   Glucose, Bld 102 (*)    All other components within normal limits  CBC - Abnormal; Notable for the following components:   WBC 10.7 (*)    All other components within normal limits  TROPONIN I (HIGH SENSITIVITY)  TROPONIN I (HIGH SENSITIVITY)    ED ECG REPORT   Date: 06/23/2021  Rate: 77  Rhythm: normal sinus rhythm  QRS Axis: normal  Intervals: normal  ST/T Wave abnormalities: normal  Conduction Disutrbances:none  Narrative Interpretation: morphology unchanged  Old EKG Reviewed: unchanged  I have personally reviewed the EKG tracing and agree with the computerized printout as noted.   Radiology DG Chest 2 View  Result Date: 06/23/2021 CLINICAL DATA:  Chest pain EXAM: CHEST - 2 VIEW COMPARISON:  02/21/2020 FINDINGS: The heart size and mediastinal contours are within normal limits. Both lungs are clear. The visualized skeletal structures are unremarkable.  IMPRESSION: No active cardiopulmonary disease. Electronically Signed   By: 02/23/2020 M.D.   On: 06/23/2021 11:10    Procedures Procedures   Medications Ordered in ED Medications - No data to display  ED Course  I have reviewed the triage vital signs and the nursing notes.  Pertinent labs & imaging results that were available during my care of the patient were reviewed by me and considered in my medical decision  making (see chart for details).  Patient seen and examined. Work-up ordered in chart including EKG reviewed.  No concerning findings.  Vital signs reviewed and are as follows: BP (!) 143/98   Pulse 78   Temp 98.5 F (36.9 C)   Resp 17   Ht 5\' 8"  (1.727 m)   Wt 103 kg   SpO2 98%   BMI 34.53 kg/m   Patient be given PCP referrals and started on low-dose amlodipine 2.5 mg, until he can follow-up with a PCP.  Patient urged to return with worsening symptoms or other concerns. Patient verbalized understanding and agrees with plan.     MDM Rules/Calculators/A&P                          Patient with hypertension with no signs of endorgan damage.  Normal creatinine.  Family history of high blood pressure.  He has had multiple measurements recently greater than 150, as high as 200 systolic.  Given this, will start on low-dose medication until he can establish care with PCP and come up with a more comprehensive plan.   Final Clinical Impression(s) / ED Diagnoses Final diagnoses:  Primary hypertension    Rx / DC Orders ED Discharge Orders          Ordered    amLODipine (NORVASC) 2.5 MG tablet  Daily        06/23/21 1428             06/25/21, PA-C 06/23/21 1451    06/25/21, MD 06/23/21 1743

## 2021-06-23 NOTE — Discharge Instructions (Signed)
Please read and follow all provided instructions.  Your diagnoses today include:  1. Primary hypertension     Your blood pressure was high today (BP): BP (!) 143/98   Pulse 78   Temp 98.5 F (36.9 C)   Resp 17   Ht 5\' 8"  (1.727 m)   Wt 103 kg   SpO2 98%   BMI 34.53 kg/m   Tests performed today include: Vital signs. See below for your results today.  Blood cell counts (white, red, and platelets) Electrolytes  Kidney function test EKG - was normal  Medications prescribed:  Amlodipine - low dose medication for blood pressure.  Please be aware that it would take several days for this blood pressure medication to take effect.  If you begin to feel lightheaded, dizzy, or pass out --please discontinue the medication until you can follow-up with your doctor. .  Home care instructions:  Follow any educational materials contained in this packet.  Follow-up instructions: Please follow-up with your primary care provider in the next 7 days for a recheck of your symptoms and blood pressure.  See referrals provided.  Return instructions:  Please return to the Emergency Department if you experience worsening symptoms.  Return with severe chest pain, abdominal pain, or shortness of breath.  Return with severe headache, focal weakness, numbness, difficulty with speech or vision. Return with loss of vision or sudden vision change. Please return if you have any other emergent concerns.  Additional Information:  Your vital signs today were: BP (!) 143/98   Pulse 78   Temp 98.5 F (36.9 C)   Resp 17   Ht 5\' 8"  (1.727 m)   Wt 103 kg   SpO2 98%   BMI 34.53 kg/m  If your blood pressure (BP) was elevated above 135/85 this visit, please have this repeated by your doctor within one month. --------------

## 2021-06-23 NOTE — ED Provider Notes (Signed)
Emergency Medicine Provider Triage Evaluation Note  Lee Dennis , a 20 y.o. male  was evaluated in triage.  Pt complains of high blood pressure  Review of Systems  Positive:high blood pressure  Negative:   Physical Exam  BP (!) 150/92   Pulse 84   Temp 98.7 F (37.1 C) (Oral)   Resp 16   SpO2 99%  Gen:   Awake, no distress   Resp:  Normal effort  MSK:   Moves extremities without difficulty  Other:    Medical Decision Making  Medically screening exam initiated at 10:25 AM.  Appropriate orders placed.  Lee Dennis was informed that the remainder of the evaluation will be completed by another provider, this initial triage assessment does not replace that evaluation, and the importance of remaining in the ED until their evaluation is complete.     Elson Areas, Cordelia Poche 06/23/21 1026    Bethann Berkshire, MD 06/25/21 1005

## 2021-06-23 NOTE — ED Triage Notes (Signed)
Pt here POV from home with complaints of high blood pressure X1 week. Endorses N/V X1 week.

## 2021-09-10 IMAGING — CR DG CHEST 2V
2 series · 2 of 2 positions shown · non-contrast
Comparison: 02/21/2020

CLINICAL DATA: Chest pain

EXAM:
CHEST - 2 VIEW

[chest pa]
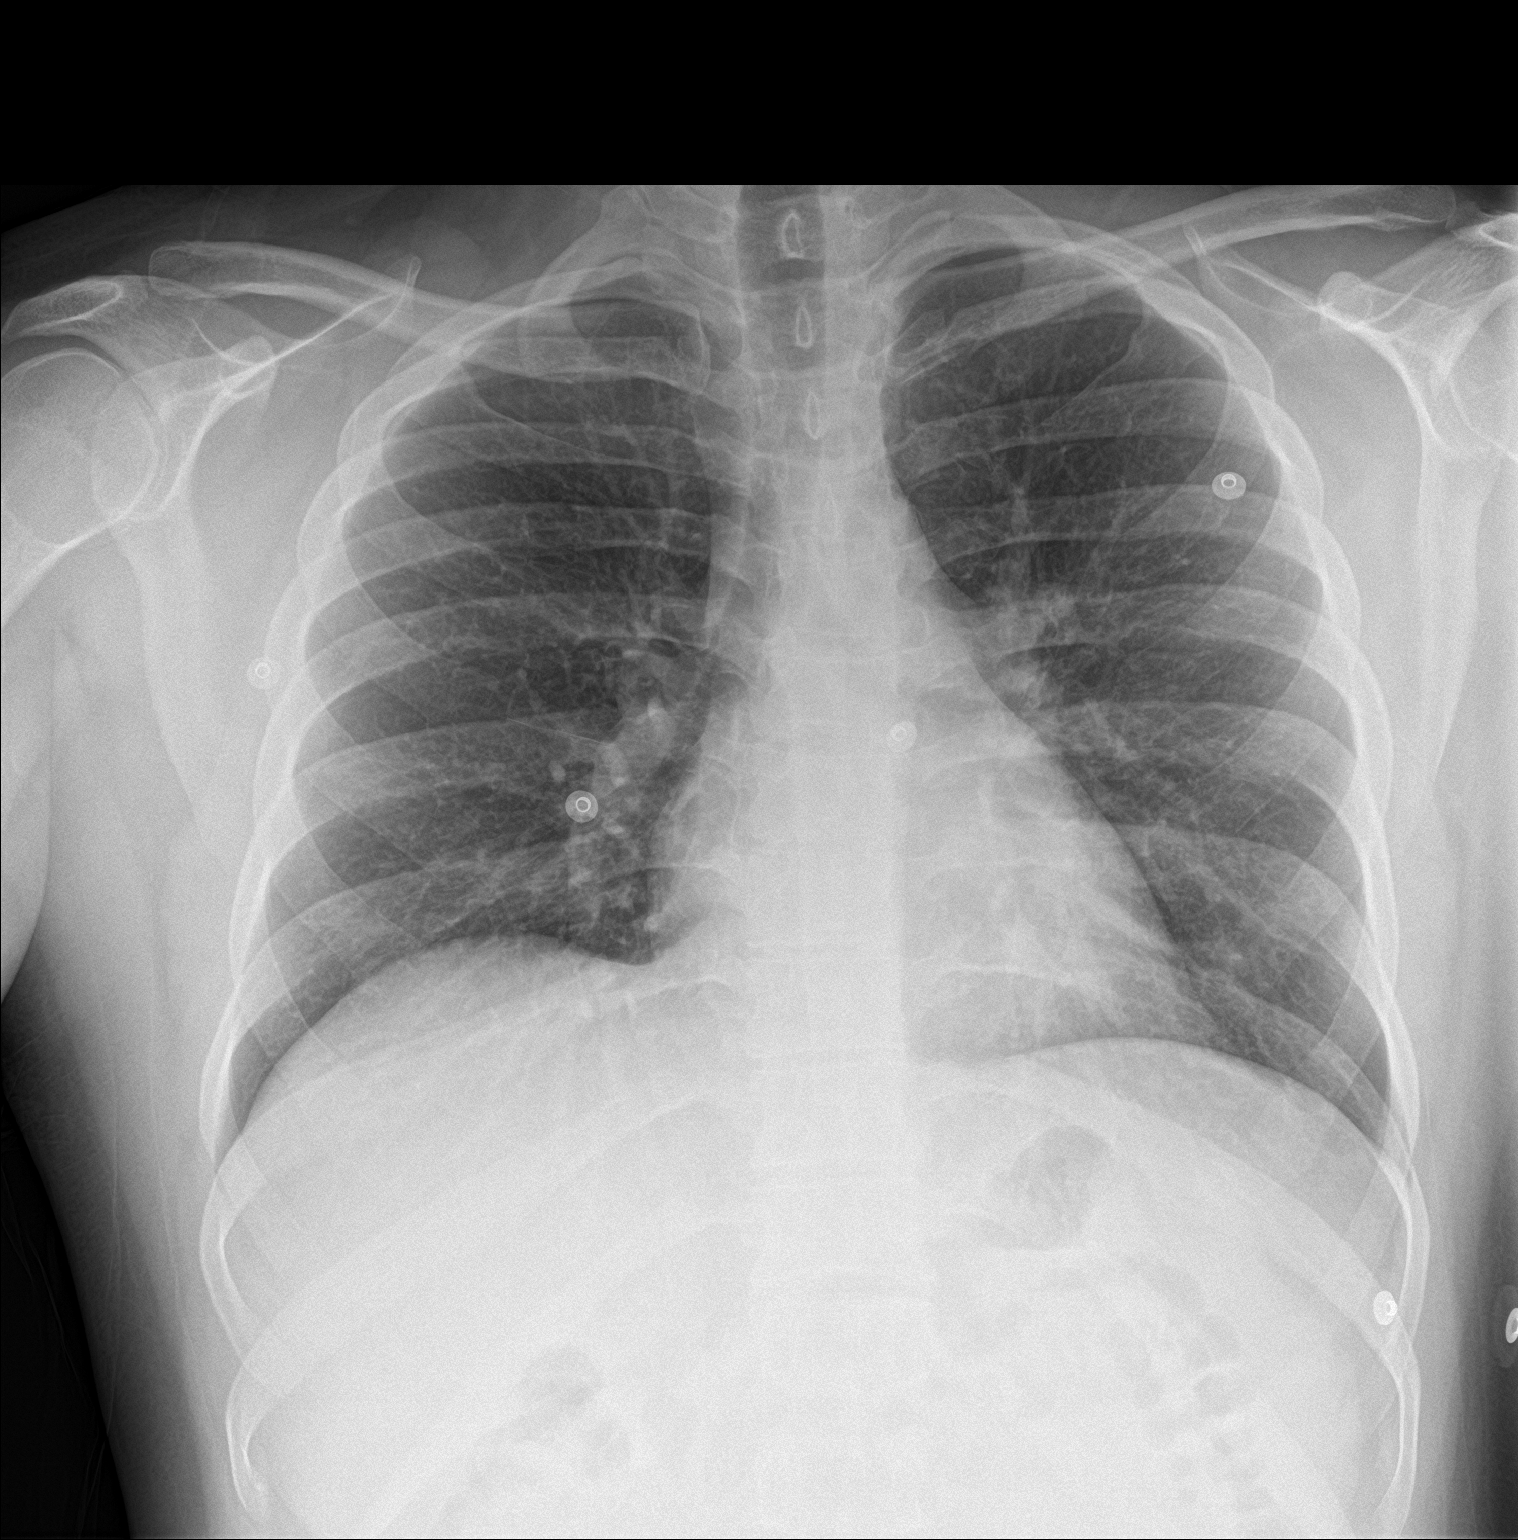

[chest lat]
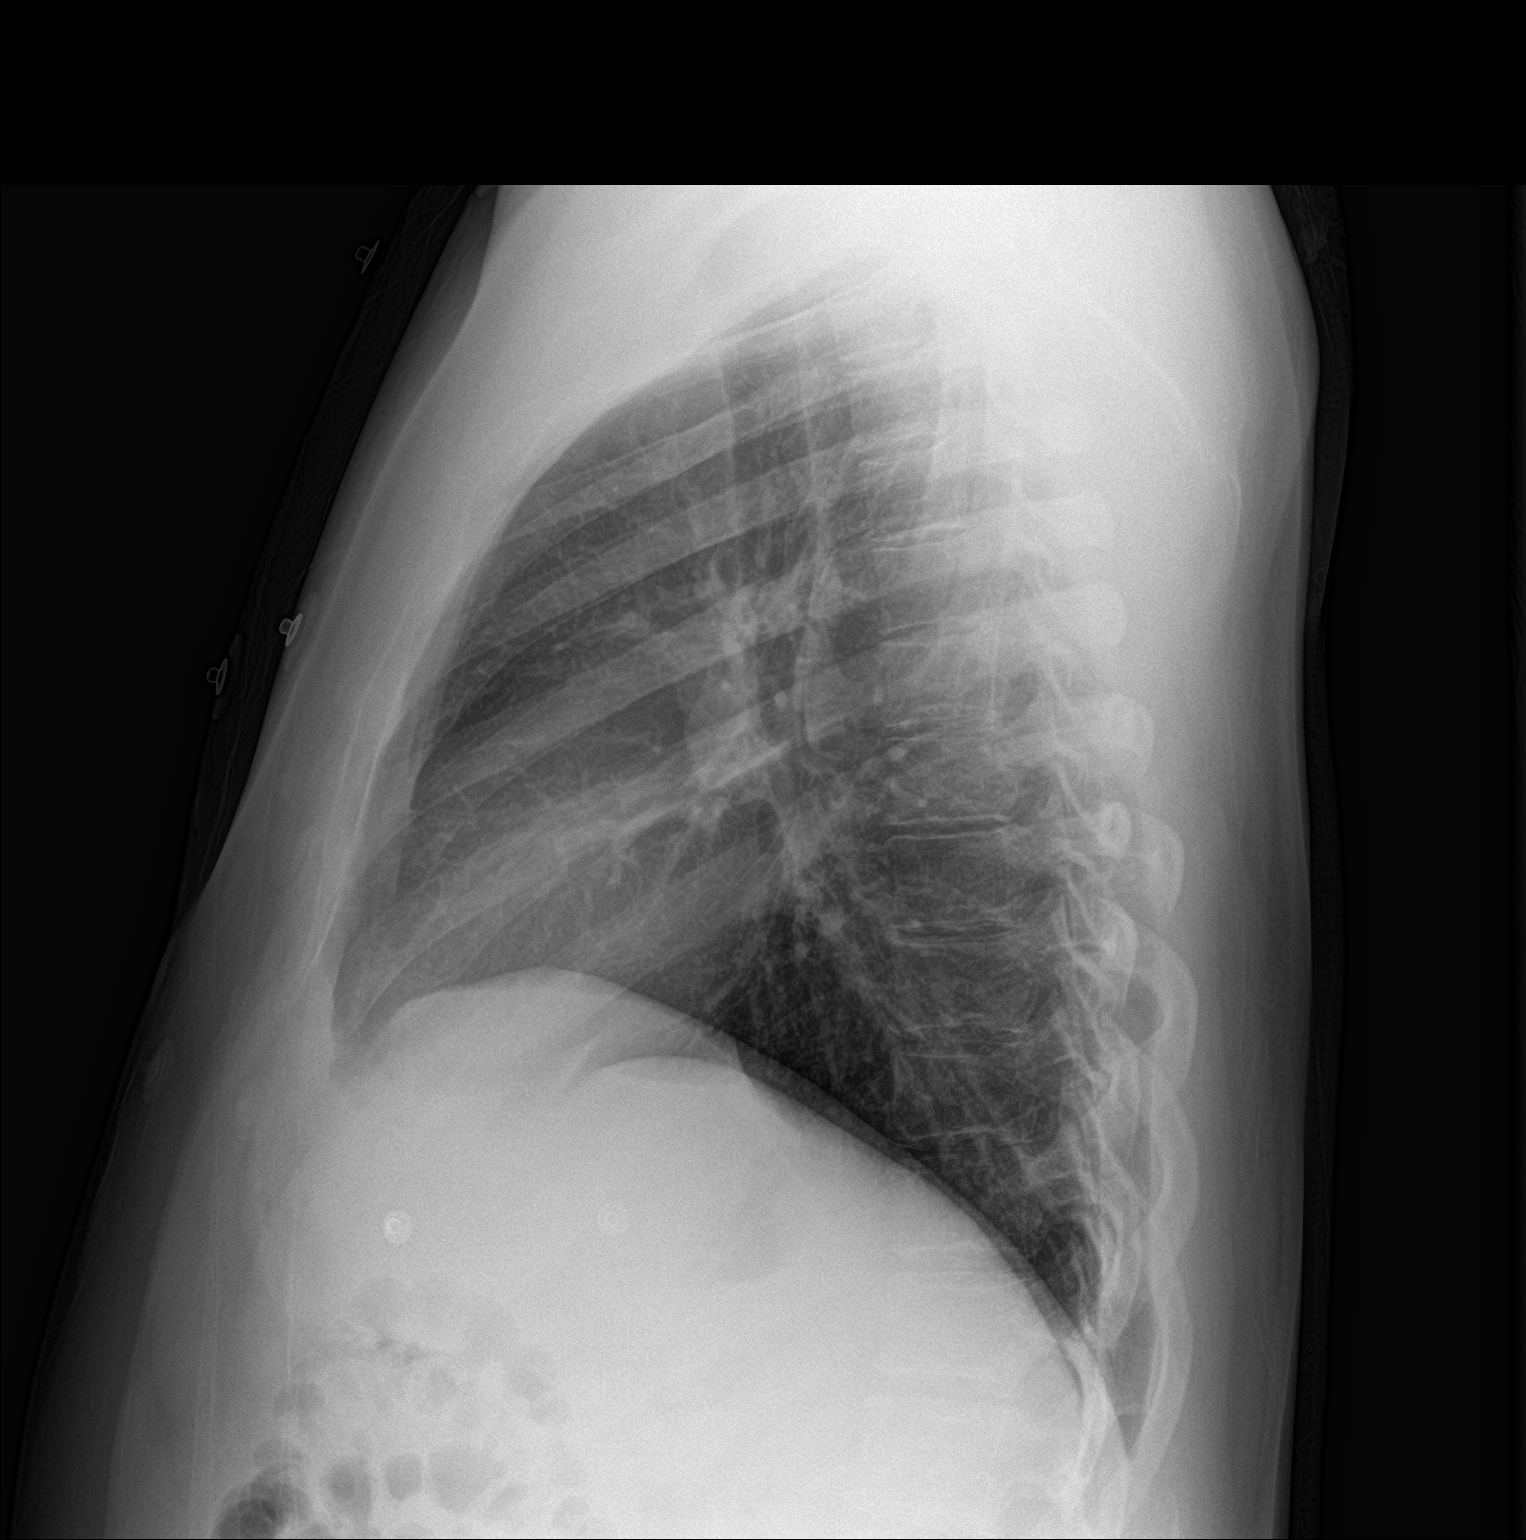

[2 of 2 positions shown; findings below may reference images not displayed]

FINDINGS: The heart size and mediastinal contours are within normal limits.
Both lungs are clear. The visualized skeletal structures are
unremarkable.
IMPRESSION: No active cardiopulmonary disease.
